# Patient Record
Sex: Male | Born: 1975 | Race: White | Hispanic: No | Marital: Single | Smoking: Never smoker
Health system: Southern US, Community
[De-identification: ages and names within clinical notes are randomized; demographics above are authoritative.]

## PROBLEM LIST (undated history)

## (undated) DIAGNOSIS — F319 Bipolar disorder, unspecified: Secondary | ICD-10-CM

## (undated) HISTORY — PX: TONSILLECTOMY: SUR1361

---

## 2008-06-14 ENCOUNTER — Emergency Department (HOSPITAL_COMMUNITY): Admission: EM | Admit: 2008-06-14 | Discharge: 2008-06-14 | Payer: Self-pay | Admitting: Emergency Medicine

## 2008-09-25 ENCOUNTER — Emergency Department (HOSPITAL_COMMUNITY): Admission: EM | Admit: 2008-09-25 | Discharge: 2008-09-25 | Payer: Self-pay | Admitting: Emergency Medicine

## 2009-09-11 ENCOUNTER — Emergency Department (HOSPITAL_BASED_OUTPATIENT_CLINIC_OR_DEPARTMENT_OTHER): Admission: EM | Admit: 2009-09-11 | Discharge: 2009-09-11 | Payer: Self-pay | Admitting: Emergency Medicine

## 2009-09-11 ENCOUNTER — Ambulatory Visit: Payer: Self-pay | Admitting: Diagnostic Radiology
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
## 2010-09-04 ENCOUNTER — Emergency Department (HOSPITAL_BASED_OUTPATIENT_CLINIC_OR_DEPARTMENT_OTHER)
Admission: EM | Admit: 2010-09-04 | Discharge: 2010-09-04 | Disposition: A | Discharge status: Home / Self Care | Payer: Managed Care, Other (non HMO) | Attending: Emergency Medicine | Admitting: Emergency Medicine

## 2010-09-04 DIAGNOSIS — F319 Bipolar disorder, unspecified: Secondary | ICD-10-CM | POA: Insufficient documentation

## 2010-09-04 DIAGNOSIS — W540XXA Bitten by dog, initial encounter: Secondary | ICD-10-CM | POA: Insufficient documentation

## 2010-09-04 DIAGNOSIS — E119 Type 2 diabetes mellitus without complications: Secondary | ICD-10-CM | POA: Insufficient documentation

## 2010-09-04 DIAGNOSIS — F988 Other specified behavioral and emotional disorders with onset usually occurring in childhood and adolescence: Secondary | ICD-10-CM | POA: Insufficient documentation

## 2010-09-04 DIAGNOSIS — S61409A Unspecified open wound of unspecified hand, initial encounter: Secondary | ICD-10-CM | POA: Insufficient documentation

## 2010-09-20 LAB — BASIC METABOLIC PANEL
BUN: 19 mg/dL (ref 6–23)
CO2: 32 mEq/L (ref 19–32)
Calcium: 9.2 mg/dL (ref 8.4–10.5)
Chloride: 100 mEq/L (ref 96–112)
Creatinine, Ser: 0.6 mg/dL (ref 0.4–1.5)
GFR calc non Af Amer: >60 mL/min (ref >60)
Glucose, Bld: 104 mg/dL — ABNORMAL HIGH (ref 70–99)
Potassium: 4.2 mEq/L (ref 3.5–5.1)

## 2010-09-20 LAB — DIFFERENTIAL
Basophils Relative: 1 % (ref 0–1)
Lymphocytes Relative: 12 % (ref 12–46)
Lymphs Abs: 2.1 10*3/uL (ref 0.7–4.0)

## 2010-09-20 LAB — URINALYSIS, ROUTINE W REFLEX MICROSCOPIC
Bilirubin Urine: NEGATIVE
Hgb urine dipstick: NEGATIVE
Ketones, ur: NEGATIVE mg/dL
Nitrite: NEGATIVE
Protein, ur: NEGATIVE mg/dL
Specific Gravity, Urine: 1.034 — ABNORMAL HIGH (ref 1.005–1.030)
Urobilinogen, UA: 1 mg/dL (ref 0.0–1.0)

## 2010-09-20 LAB — HEPATIC FUNCTION PANEL
ALT: 14 U/L (ref 0–53)
Alkaline Phosphatase: 56 U/L (ref 39–117)
Bilirubin, Direct: 0 mg/dL (ref 0.0–0.3)
Indirect Bilirubin: 0.3 mg/dL (ref 0.3–0.9)
Total Bilirubin: 0.3 mg/dL (ref 0.3–1.2)
Total Protein: 8.1 g/dL (ref 6.0–8.3)

## 2010-09-20 LAB — CBC: WBC: 17 10*3/uL — ABNORMAL HIGH (ref 4.0–10.5)

## 2010-10-06 LAB — URINALYSIS, ROUTINE W REFLEX MICROSCOPIC
Bilirubin Urine: NEGATIVE
Hgb urine dipstick: NEGATIVE
Ketones, ur: NEGATIVE mg/dL
Nitrite: NEGATIVE
Specific Gravity, Urine: 1.008 (ref 1.005–1.030)

## 2010-10-06 LAB — RAPID URINE DRUG SCREEN, HOSP PERFORMED
Amphetamines: POSITIVE — AB
Barbiturates: NOT DETECTED
Opiates: NOT DETECTED

## 2010-10-06 LAB — CBC
Hemoglobin: 13.8 g/dL (ref 13.0–17.0)
MCHC: 35.5 g/dL (ref 30.0–36.0)
Platelets: 221 10*3/uL (ref 150–400)
RBC: 4.25 MIL/uL (ref 4.22–5.81)
WBC: 8.2 10*3/uL (ref 4.0–10.5)

## 2010-10-06 LAB — DIFFERENTIAL
Basophils Absolute: 0 10*3/uL (ref 0.0–0.1)
Basophils Relative: 0 % (ref 0–1)
Neutro Abs: 4.1 10*3/uL (ref 1.7–7.7)
Neutrophils Relative %: 50 % (ref 43–77)

## 2010-10-06 LAB — POCT I-STAT, CHEM 8
BUN: 15 mg/dL (ref 6–23)
Glucose, Bld: 121 mg/dL — ABNORMAL HIGH (ref 70–99)
HCT: 40 % (ref 39.0–52.0)
Potassium: 3.6 mEq/L (ref 3.5–5.1)
Sodium: 139 mEq/L (ref 135–145)

## 2011-03-26 IMAGING — CT CT HEAD W/O CM
1 series · 16 of 30 positions shown, 20 images · non-contrast
Comparison: 09/25/2008.

CLINICAL DATA: Dizziness and lightheaded.  Confusion.  Headache.

CT HEAD WITHOUT CONTRAST
TECHNIQUE: Contiguous axial images were obtained from the base of
the skull through the vertex without contrast.

[Series 2: head 4.8 h37s · axial · 0.45mm/px · z∈[-105,+55]mm · 16 of 36 slices shown, 20 images]
[im 2/36  brain]
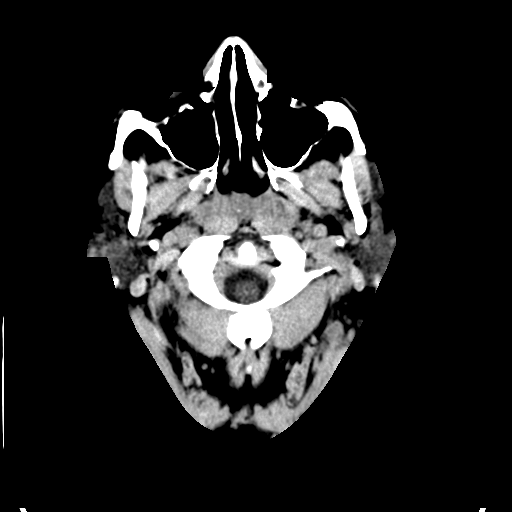
[im 2/36  bone]
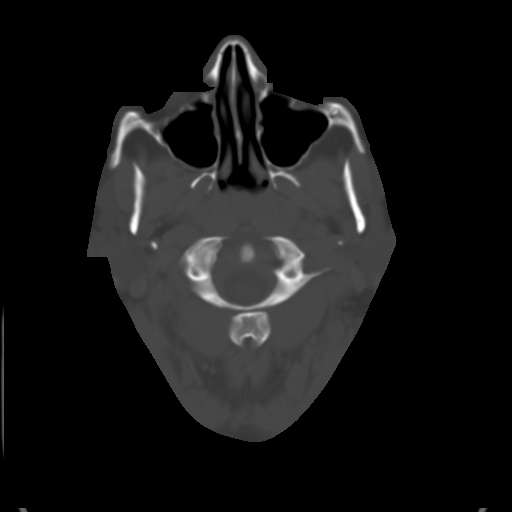
[im 4/36  brain]
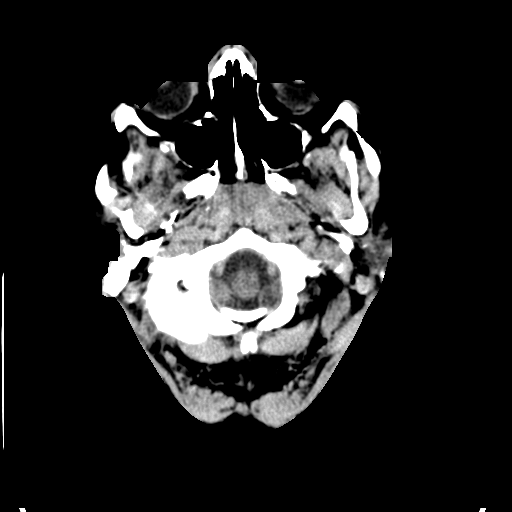
[im 7/36  brain]
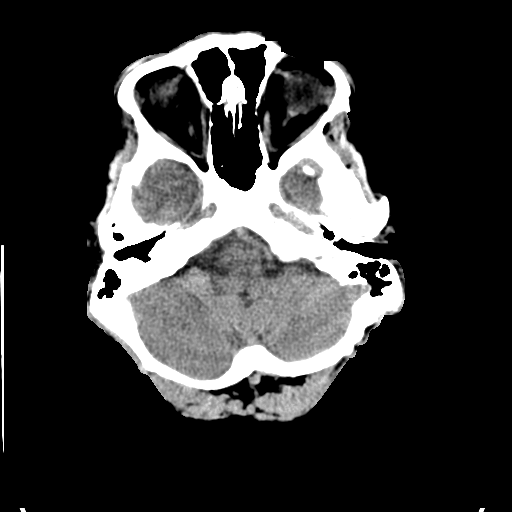
[im 9/36  brain]
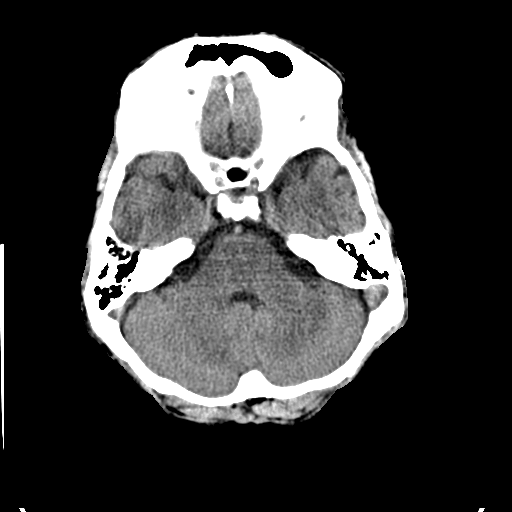
[im 10/36  brain]
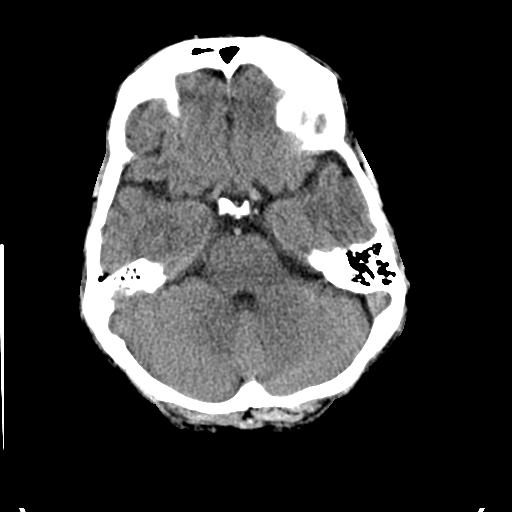
[im 10/36  bone]
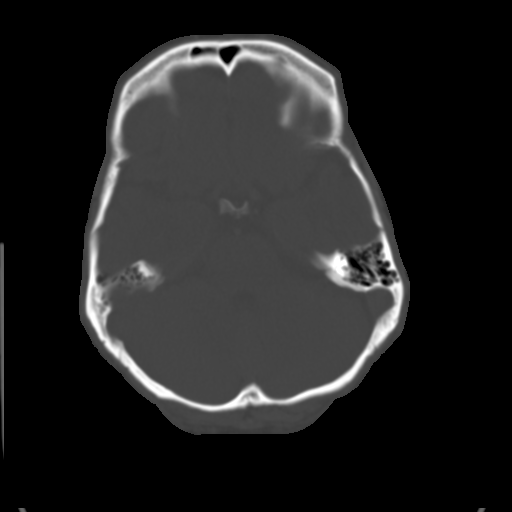
[im 13/36  brain]
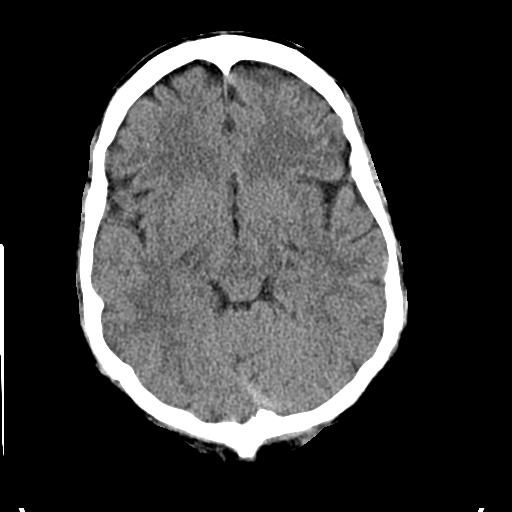
[im 15/36  brain]
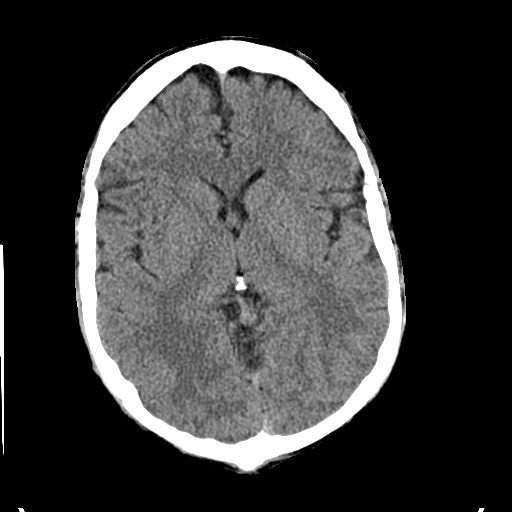
[im 17/36  brain]
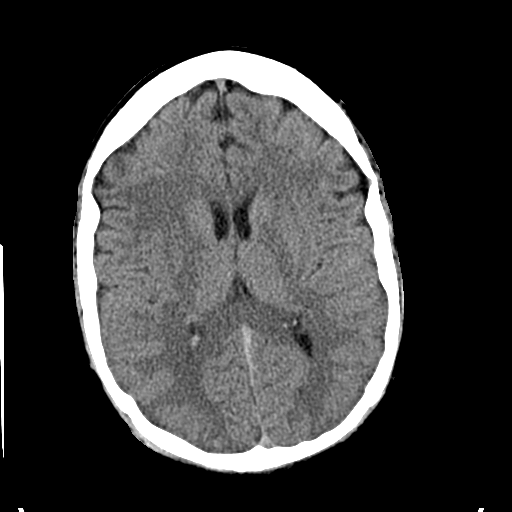
[im 19/36  brain]
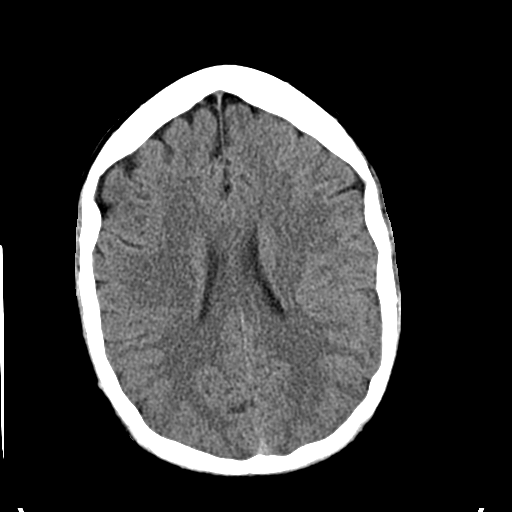
[im 19/36  bone]
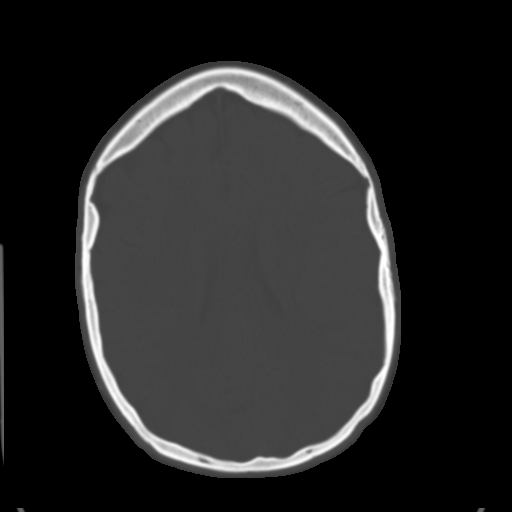
[im 21/36  brain]
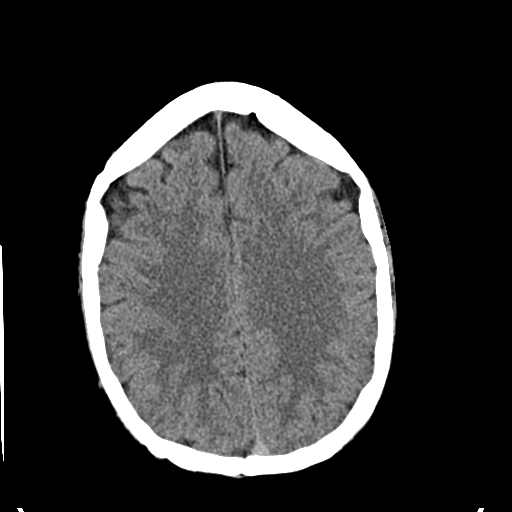
[im 23/36  brain]
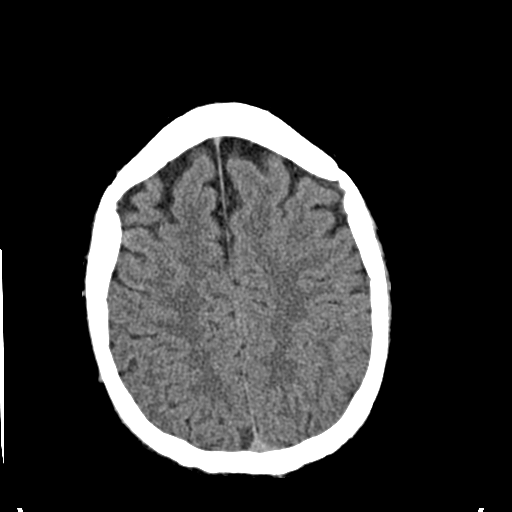
[im 26/36  brain]
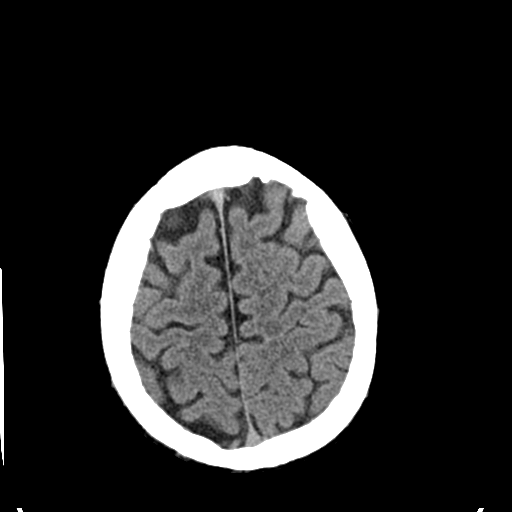
[im 27/36  brain]
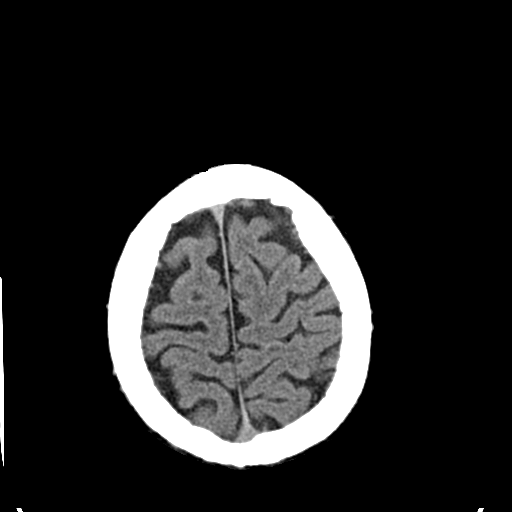
[im 27/36  bone]
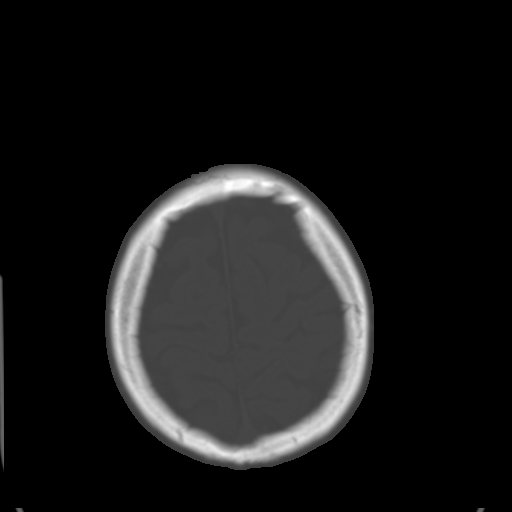
[im 29/36  brain]
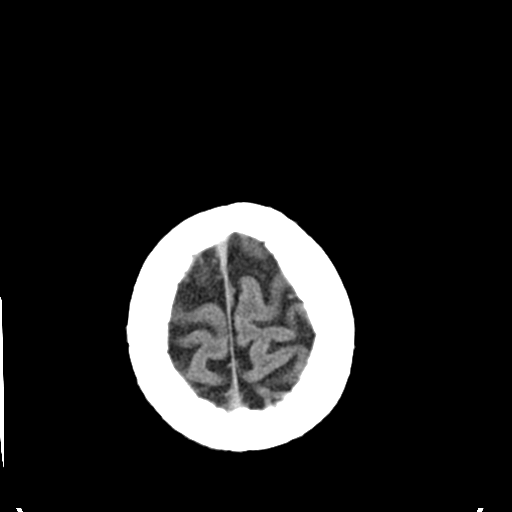
[im 32/36  brain]
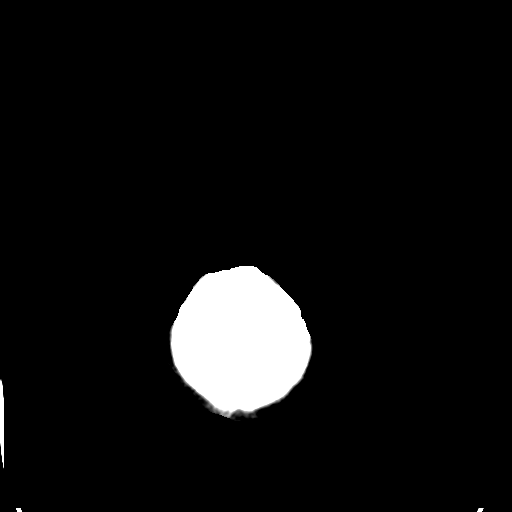
[im 34/36  brain]
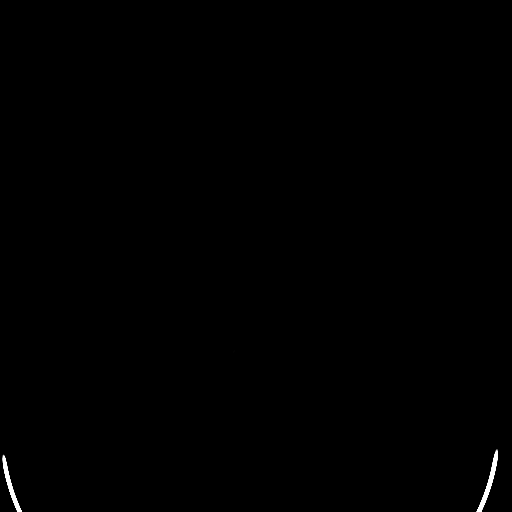

[16 of 30 positions shown; findings below may reference images not displayed]

FINDINGS: Ventricle size is normal.  Negative for infarct or mass
lesion.  The white matter appears normal.  There is no intracranial
hemorrhage.  The calvarium is intact.
IMPRESSION: Normal study.

## 2011-08-31 ENCOUNTER — Emergency Department (INDEPENDENT_AMBULATORY_CARE_PROVIDER_SITE_OTHER): Payer: Managed Care, Other (non HMO)

## 2011-08-31 ENCOUNTER — Emergency Department (HOSPITAL_BASED_OUTPATIENT_CLINIC_OR_DEPARTMENT_OTHER)
Admission: EM | Admit: 2011-08-31 | Discharge: 2011-08-31 | Disposition: A | Discharge status: Home Health | Payer: Managed Care, Other (non HMO) | Attending: Emergency Medicine | Admitting: Emergency Medicine

## 2011-08-31 ENCOUNTER — Encounter (HOSPITAL_BASED_OUTPATIENT_CLINIC_OR_DEPARTMENT_OTHER): Payer: Self-pay | Admitting: Student

## 2011-08-31 DIAGNOSIS — Z0389 Encounter for observation for other suspected diseases and conditions ruled out: Secondary | ICD-10-CM

## 2011-08-31 DIAGNOSIS — F3189 Other bipolar disorder: Secondary | ICD-10-CM | POA: Insufficient documentation

## 2011-08-31 DIAGNOSIS — IMO0002 Reserved for concepts with insufficient information to code with codable children: Secondary | ICD-10-CM

## 2011-08-31 DIAGNOSIS — S51809A Unspecified open wound of unspecified forearm, initial encounter: Secondary | ICD-10-CM | POA: Insufficient documentation

## 2011-08-31 DIAGNOSIS — S61409A Unspecified open wound of unspecified hand, initial encounter: Secondary | ICD-10-CM | POA: Insufficient documentation

## 2011-08-31 DIAGNOSIS — W540XXA Bitten by dog, initial encounter: Secondary | ICD-10-CM

## 2011-08-31 DIAGNOSIS — T148XXA Other injury of unspecified body region, initial encounter: Secondary | ICD-10-CM

## 2011-08-31 DIAGNOSIS — S61509A Unspecified open wound of unspecified wrist, initial encounter: Secondary | ICD-10-CM

## 2011-08-31 DIAGNOSIS — M79609 Pain in unspecified limb: Secondary | ICD-10-CM | POA: Insufficient documentation

## 2011-08-31 HISTORY — DX: Bipolar disorder, unspecified: F31.9

## 2011-08-31 MED ORDER — BACITRACIN ZINC 500 UNIT/GM EX OINT: TOPICAL_OINTMENT | Freq: Two times a day (BID) | CUTANEOUS | Status: AC

## 2011-08-31 MED ORDER — IBUPROFEN 800 MG PO TABS
800.0000 mg | ORAL_TABLET | Freq: Once | ORAL | Status: AC
  Administered 2011-08-31: 800 mg via ORAL
  Filled 2011-08-31: qty 1

## 2011-08-31 MED ORDER — IBUPROFEN 600 MG PO TABS: 600.0000 mg | ORAL_TABLET | Freq: Four times a day (QID) | ORAL | Status: AC | PRN

## 2011-08-31 MED ORDER — AMOXICILLIN-POT CLAVULANATE 875-125 MG PO TABS: 1.0000 | ORAL_TABLET | Freq: Two times a day (BID) | ORAL | Status: AC

## 2011-08-31 NOTE — ED Provider Notes (Signed)
History     CSN: [id number]  Arrival date & time 08/31/11  1504   First MD Initiated Contact with Patient 08/31/11 1523      Chief Complaint  Patient presents with  . Animal Bite    (Consider location/radiation/quality/duration/timing/severity/associated sxs/prior treatment) HPI  35yoM presents after dog bite. Pt describes unprovoked dog bite just prior to arrival. States that it was his personal dog. Rabies immunizations UTD. States dog was behaving normally prior to and after event. Describes. Min pain to R hand and L wrist/forearm with min bleeding. His tetanus is UTD. Denies new numbness/tingling/weakness of extremities.     Past Medical History  Diagnosis Date  . Bipolar affective disorder     Past Surgical History  Procedure Date  . Tonsillectomy     History reviewed. No pertinent family history.  History  Substance Use Topics  . Smoking status: Never Smoker   . Smokeless tobacco: Not on file  . Alcohol Use: No    Review of Systems  All other systems reviewed and are negative.   except as noted HPI  Allergies  Codeine  Home Medications   Current Outpatient Rx  Name Route Sig Dispense Refill  . AMPHETAMINE-DEXTROAMPHETAMINE 20 MG PO TABS Oral Take 20 mg by mouth 2 (two) times daily.    Marland Kitchen GABAPENTIN 400 MG PO CAPS Oral Take 400 mg by mouth 3 (three) times daily.    Marland Kitchen PAROXETINE HCL 30 MG PO TABS Oral Take 30 mg by mouth every morning.    Marland Kitchen AMOXICILLIN-POT CLAVULANATE 875-125 MG PO TABS Oral Take 1 tablet by mouth 2 (two) times daily. 10 tablet 0  . BACITRACIN ZINC 500 UNIT/GM EX OINT Topical Apply topically 2 (two) times daily. 120 g 0  . IBUPROFEN 600 MG PO TABS Oral Take 1 tablet (600 mg total) by mouth every 6 (six) hours as needed for pain. 30 tablet 0    BP 118/74  Pulse 87  Temp(Src) 98.3 F (36.8 C) (Oral)  Resp 20  Wt 170 lb (77.111 kg)  SpO2 100%  Physical Exam  Nursing note and vitals reviewed. Constitutional: He is oriented to  person, place, and time. He appears well-developed and well-nourished. No distress.  HENT:  Head: Atraumatic.  Mouth/Throat: Oropharynx is clear and moist.  Eyes: Conjunctivae are normal. Pupils are equal, round, and reactive to light.  Neck: Neck supple.  Cardiovascular: Normal rate, regular rhythm, normal heart sounds and intact distal pulses.  Exam reveals no gallop and no friction rub.   No murmur heard. Pulmonary/Chest: Effort normal. No respiratory distress. He has no wheezes. He has no rales.  Abdominal: Soft. Bowel sounds are normal. There is no tenderness. There is no rebound and no guarding.  Musculoskeletal: Normal range of motion. He exhibits no edema and no tenderness.  Neurological: He is alert and oriented to person, place, and time.  Skin: Skin is warm and dry.     Psychiatric: He has a normal mood and affect.    ED Course  Procedures (including critical care time)  Labs Reviewed - No data to display Dg Wrist Complete Left  08/31/2011  *RADIOLOGY REPORT*  Clinical Data: Dog bite  LEFT WRIST - COMPLETE 3+ VIEW  Comparison: None.  Findings: No acute fracture and no dislocation.  Unremarkable soft tissues. Mild negative ulnar variance.  IMPRESSION: No acute bony pathology.  Original Report Authenticated By: Donavan Burnet, M.D.   Dg Hand Complete Right  08/31/2011  *RADIOLOGY REPORT*  Clinical Data:  Dog bite  RIGHT HAND - COMPLETE 3+ VIEW  Comparison: None.  Findings: No acute fracture and no dislocation.  IMPRESSION: No acute bony pathology.  Original Report Authenticated By: Donavan Burnet, M.D.    1. Dog bite   2. Laceration   3. Puncture wound     MDM  Domestic dog bite with multiple laceration/puncture wounds. Dog's rabies imm utd. Patient's tetanus UTD. Wound care provided. Bacitracin applied. No suture repair. Patient given ibuprofen, bacitracin, prescription for augmentin for cellulitis prophylaxis- 5 day course. Given strict precautions for return.   PMD Dr.  Lendon Colonel, [facility]       Forbes Cellar, MD 08/31/11 [phone]

## 2011-08-31 NOTE — ED Notes (Signed)
Pt in with c/o of dog bite from family dog to right hand and left wrist. Bleeding controlled prior to arrival to ED. Puncture marks noted to left wrist and between right thumb and hand. Per pt, the dog is current on rabies vaccinations.

## 2011-08-31 NOTE — Discharge Instructions (Signed)
Animal Bite  An animal bite can result in a scratch on the skin, deep open cut, puncture of the skin, crush injury, or tearing away of the skin or a body part. Dogs are responsible for most animal bites. Children are bitten more often than adults. An animal bite can range from very mild to more serious. A small bite from your house pet is no cause for alarm. However, some animal bites can become infected or injure a bone or other tissue. You must seek medical care if:  · The skin is broken and bleeding does not slow down or stop after 15 minutes.  · The puncture is deep and difficult to clean (such as a cat bite).  · Pain, warmth, redness, or pus develops around the wound.  · The bite is from a stray animal or rodent. There may be a risk of rabies infection.  · The bite is from a snake, raccoon, skunk, fox, coyote, or bat. There may be a risk of rabies infection.  · The person bitten has a chronic illness such as diabetes, liver disease, or cancer, or the person takes medicine that lowers the immune system.  · There is concern about the location and severity of the bite.  It is important to clean and protect an animal bite wound right away to prevent infection. Follow these steps:  · Clean the wound with plenty of water and soap.  · Apply an antibiotic cream.  · Apply gentle pressure over the wound with a clean towel or gauze to slow or stop bleeding.  · Elevate the affected area above the heart to help stop any bleeding.  · Seek medical care. Getting medical care within 8 hours of the animal bite leads to the best possible outcome.  DIAGNOSIS   Your caregiver will most likely:  · Take a detailed history of the animal and the bite injury.  · Perform a wound exam.  · Take your medical history.  Blood tests or X-rays may be performed. Sometimes, infected bite wounds are cultured and sent to a lab to identify the infectious bacteria.   TREATMENT   Medical treatment will depend on the location and type of animal bite as  well as the patient's medical history. Treatment may include:  · Wound care, such as cleaning and flushing the wound with saline solution, bandaging, and elevating the affected area.  · Antibiotics.  · Tetanus immunization.  · Rabies immunization.  · Leaving the wound open to heal. This is often done with animal bites, due to the high risk of infection. However, in certain cases, wound closure with stitches, wound adhesive, skin adhesive strips, or staples may be used.   Infected bites that are left untreated may require intravenous (IV) antibiotics and surgical treatment in the hospital.  HOME CARE INSTRUCTIONS  · Follow your caregiver's instructions for wound care.  · Take all medicines as directed.  · If your caregiver prescribes antibiotics, take them as directed. Finish them even if you start to feel better.  · Follow up with your caregiver for further exams or immunizations as directed.  You may need a tetanus shot if:  · You cannot remember when you had your last tetanus shot.  · You have never had a tetanus shot.  · The injury broke your skin.  If you get a tetanus shot, your arm may swell, get red, and feel warm to the touch. This is common and not a problem. If you need a tetanus   shot and you choose not to have one, there is a rare chance of getting tetanus. Sickness from tetanus can be serious.  SEEK MEDICAL CARE IF:  · You notice warmth, redness, soreness, swelling, pus discharge, or a bad smell coming from the wound.  · You have a red line on the skin coming from the wound.  · You have a fever, chills, or a general ill feeling.  · You have nausea or vomiting.  · You have continued or worsening pain.  · You have trouble moving the injured part.  · You have other questions or concerns.  MAKE SURE YOU:  · Understand these instructions.  · Will watch your condition.  · Will get help right away if you are not doing well or get worse.  Document Released: 03/01/2011 Document Revised: 06/02/2011 Document  Reviewed: 03/01/2011  [employer]® Patient Information ©2012 [employer].

## 2013-08-13 ENCOUNTER — Encounter (HOSPITAL_COMMUNITY): Payer: Self-pay | Admitting: Emergency Medicine

## 2013-08-13 ENCOUNTER — Emergency Department (HOSPITAL_COMMUNITY)
Admission: EM | Admit: 2013-08-13 | Discharge: 2013-08-13 | Disposition: A | Discharge status: Home / Self Care | Payer: Managed Care, Other (non HMO) | Attending: Emergency Medicine | Admitting: Emergency Medicine

## 2013-08-13 DIAGNOSIS — Z79899 Other long term (current) drug therapy: Secondary | ICD-10-CM | POA: Insufficient documentation

## 2013-08-13 DIAGNOSIS — M5442 Lumbago with sciatica, left side: Secondary | ICD-10-CM

## 2013-08-13 DIAGNOSIS — M5430 Sciatica, unspecified side: Secondary | ICD-10-CM | POA: Insufficient documentation

## 2013-08-13 DIAGNOSIS — M533 Sacrococcygeal disorders, not elsewhere classified: Secondary | ICD-10-CM | POA: Insufficient documentation

## 2013-08-13 DIAGNOSIS — G8929 Other chronic pain: Secondary | ICD-10-CM | POA: Insufficient documentation

## 2013-08-13 DIAGNOSIS — Z791 Long term (current) use of non-steroidal anti-inflammatories (NSAID): Secondary | ICD-10-CM | POA: Insufficient documentation

## 2013-08-13 DIAGNOSIS — F319 Bipolar disorder, unspecified: Secondary | ICD-10-CM | POA: Insufficient documentation

## 2013-08-13 MED ORDER — METHOCARBAMOL 500 MG PO TABS: 500.0000 mg | ORAL_TABLET | Freq: Three times a day (TID) | ORAL | Status: AC | PRN

## 2013-08-13 NOTE — Progress Notes (Signed)
   CARE MANAGEMENT ED NOTE 08/13/2013  Patient:  Alex Waller,Alex Waller   Account Number:  [id number]  Date Initiated:  08/13/2013  Documentation initiated by:  Radford PaxFERRERO,Aerica Rincon  Subjective/Objective Assessment:   Patient presents to Ed with left buttock pain.  Patient bent over at work but unable to stand back up.     Subjective/Objective Assessment Detail:     Action/Plan:   Action/Plan Detail:   Anticipated DC Date:  08/13/2013     Status Recommendation to Physician:   Result of Recommendation:    Other ED Services  Consult Working Plan    DC Planning Services  Other  PCP issues    Choice offered to / List presented to:            Status of service:  Completed, signed off  ED Comments:   ED Comments Detail:  Patient confirms his pcp is Dr. Linde GillisSusan Hedgecock of [facility].  Patient reports his neurologist is Dr. Epimenio FootSater.  System updated.

## 2013-08-13 NOTE — ED Provider Notes (Signed)
CSN: [id number]     Arrival date & time 08/13/13  1536 History  This chart was scribed for Junius Finner, PA working with Raeford Razor, MD by Quintella Reichert, ED Scribe. This patient was seen in room WTR7/WTR7 and the patient's care was started at 5:20 PM.  Chief Complaint  Patient presents with  . Leg Pain    The history is provided by the patient. No language interpreter was used.    HPI Comments: Alex Waller is a 38 y.o. male brought in by ambulance to the Emergency Department complaining of several hours exacerbation of his chronic left buttock pain.  Pt states he has a bulging disc at L5 and has chronic pain in his left buttock whenever he bends over.  He received an epidural on 2/6 and has a f/u appointment scheduled.  He came to the ED today because he was at work today and bent over and suddenly became unable to stand back up.  He reports his pain has also been worse since the epidural and has been radiating into his left thigh.  He has not taken any pain medications since this incident and states his pain has been gradually improving on its own.  He takes cyclobenzaprine and nabumetone regularly and took both of these earlier today.  He states that these medications are not helping his chronic pain.  He has not called his neurologist today. Denies fever, n/v/d. Denies change in bowel or bladder habits. Denies numbness or tingling in legs or groin. Denies hx of cancer or IVDU.   Past Medical History  Diagnosis Date  . Bipolar affective disorder     Past Surgical History  Procedure Laterality Date  . Tonsillectomy      History reviewed. No pertinent family history.   History  Substance Use Topics  . Smoking status: Never Smoker   . Smokeless tobacco: Not on file  . Alcohol Use: No     Review of Systems  Musculoskeletal: Positive for arthralgias (left buttock pain).  Neurological: Negative for numbness.  All other systems reviewed and are negative.      Allergies   Clonazepam and Codeine  Home Medications   Current Outpatient Rx  Name  Route  Sig  Dispense  Refill  . amphetamine-dextroamphetamine (ADDERALL) 20 MG tablet   Oral   Take 20 mg by mouth 2 (two) times daily.         Marland Kitchen gabapentin (NEURONTIN) 400 MG capsule   Oral   Take 400 mg by mouth 3 (three) times daily.         . meloxicam (MOBIC) 15 MG tablet   Oral   Take 15 mg by mouth at bedtime.         . nabumetone (RELAFEN) 750 MG tablet   Oral   Take 750 mg by mouth daily.         Marland Kitchen PARoxetine (PAXIL) 30 MG tablet   Oral   Take 30 mg by mouth every morning.         . methocarbamol (ROBAXIN) 500 MG tablet   Oral   Take 1 tablet (500 mg total) by mouth every 8 (eight) hours as needed for muscle spasms.   10 tablet   0    BP 143/89  Pulse 103  Temp(Src) 98.3 F (36.8 C) (Oral)  Resp 18  SpO2 97%  Physical Exam  Nursing note and vitals reviewed. Constitutional: He is oriented to person, place, and time. He appears well-developed and well-nourished.  HENT:  Head: Normocephalic and atraumatic.  Eyes: EOM are normal.  Neck: Normal range of motion.  Cardiovascular: Normal rate.   Pulmonary/Chest: Effort normal.  Musculoskeletal: He exhibits tenderness.  Tender along lower L5-S1 region and musculature.  Tenderness along left hip and left lateral thigh.  Antalgic gait.  Pain worse with flexion at the hip.  5/5 strength in lower extremities bilaterally.  Neurological: He is alert and oriented to person, place, and time.  Skin: Skin is warm and dry.  Psychiatric: He has a normal mood and affect. His behavior is normal.    ED Course  Procedures (including critical care time)  DIAGNOSTIC STUDIES: Oxygen Saturation is 97% on room air, normal by my interpretation.    COORDINATION OF CARE: 5:31 PM-Discussed treatment plan which includes muscle relaxants and f/u with his neurologist with pt at bedside and pt agreed to plan.     Labs Review Labs Reviewed - No  data to display  Imaging Review No results found.  EKG Interpretation   None       MDM   Final diagnoses:  Left-sided low back pain with sciatica    Pt with hx of chronic left buttock pain due to a "herniated disc" presenting via EMS for exacerbation of low back pain radiating into left leg.  Pt reports being "unable to stand back up" after lifting a box at work, however reports pain gradually improving on its own since being in ED.  Pt has not taken any pain medication since onset of pain earlier today.  No red flag symptoms. Offered pt pain medication in ED, pt declined stating he did have medication at home and he did not have a ride home if he did have medication in the ED.  Pt was offered refill on pain medication but pt stated they do not work.  Advised pt to discontinue cyclobenzaprine and start robaxin to see if this helps relieve his pain more. Strongly encouraged pt f/u with his neurosurgeon as soon as possible to discuss continued worsening pain since epidural on 08/02/13.  Advised pt to not perform heavy lifting for the next 5 days. Advised this is likely what contributed to exacerbation today. Return precautions provided. Pt verbalized understanding and agreement with tx plan.    I personally performed the services described in this documentation, which was scribed in my presence. The recorded information has been reviewed and is accurate.   Junius Finnerrin O'Malley, PA-C 08/14/13 1045

## 2013-08-13 NOTE — ED Notes (Signed)
Per EMS, Pt c/o LLE pain x 3 months.  Pt was at work, bent over, and then, couldn't stand back up.  Pt reports having an epidural on 2/6.

## 2013-08-13 NOTE — ED Notes (Signed)
Initial Contact - pt resting in chair, reports hx LLE pain x3 mo and while at work today, pt reports "bent over and couldn't stand back up".  Pt denies n/t to extremities, denies other complaints.  Otherwise well appearing, skin PWD.  MAEI.  NAD.  Awaiting EDPA eval.

## 2013-08-17 NOTE — ED Provider Notes (Signed)
Medical screening examination/treatment/procedure(s) were performed by non-physician practitioner and as supervising physician I was immediately available for consultation/collaboration.  EKG Interpretation   None        Mitsue Peery, MD 08/17/13 0939
